# Patient Record
Sex: Male | Born: 1935
Health system: Southern US, Community
[De-identification: ages and names within clinical notes are randomized; demographics above are authoritative.]

## PROBLEM LIST (undated history)

## (undated) DIAGNOSIS — R42 Dizziness and giddiness: Secondary | ICD-10-CM

## (undated) DIAGNOSIS — M1990 Unspecified osteoarthritis, unspecified site: Secondary | ICD-10-CM

## (undated) DIAGNOSIS — I10 Essential (primary) hypertension: Secondary | ICD-10-CM

## (undated) DIAGNOSIS — E119 Type 2 diabetes mellitus without complications: Secondary | ICD-10-CM

## (undated) DIAGNOSIS — I499 Cardiac arrhythmia, unspecified: Secondary | ICD-10-CM

## (undated) HISTORY — PX: EYE SURGERY: SHX253

---

## 2004-05-14 ENCOUNTER — Ambulatory Visit (HOSPITAL_COMMUNITY): Admission: RE | Admit: 2004-05-14 | Discharge: 2004-05-15 | Payer: Self-pay | Admitting: Ophthalmology

## 2004-08-23 ENCOUNTER — Ambulatory Visit (HOSPITAL_COMMUNITY): Admission: RE | Admit: 2004-08-23 | Discharge: 2004-08-24 | Payer: Self-pay | Admitting: Ophthalmology

## 2004-09-10 ENCOUNTER — Ambulatory Visit (HOSPITAL_COMMUNITY): Admission: RE | Admit: 2004-09-10 | Discharge: 2004-09-11 | Payer: Self-pay | Admitting: Ophthalmology

## 2005-06-19 IMAGING — CR DG CHEST 2V
2 series · 2 of 2 positions shown · non-contrast
Comparison: None.

CLINICAL DATA: Pre-op for retinal detachment. 
 CHEST - TWO VIEW:

[view not recorded (1 of 2)]
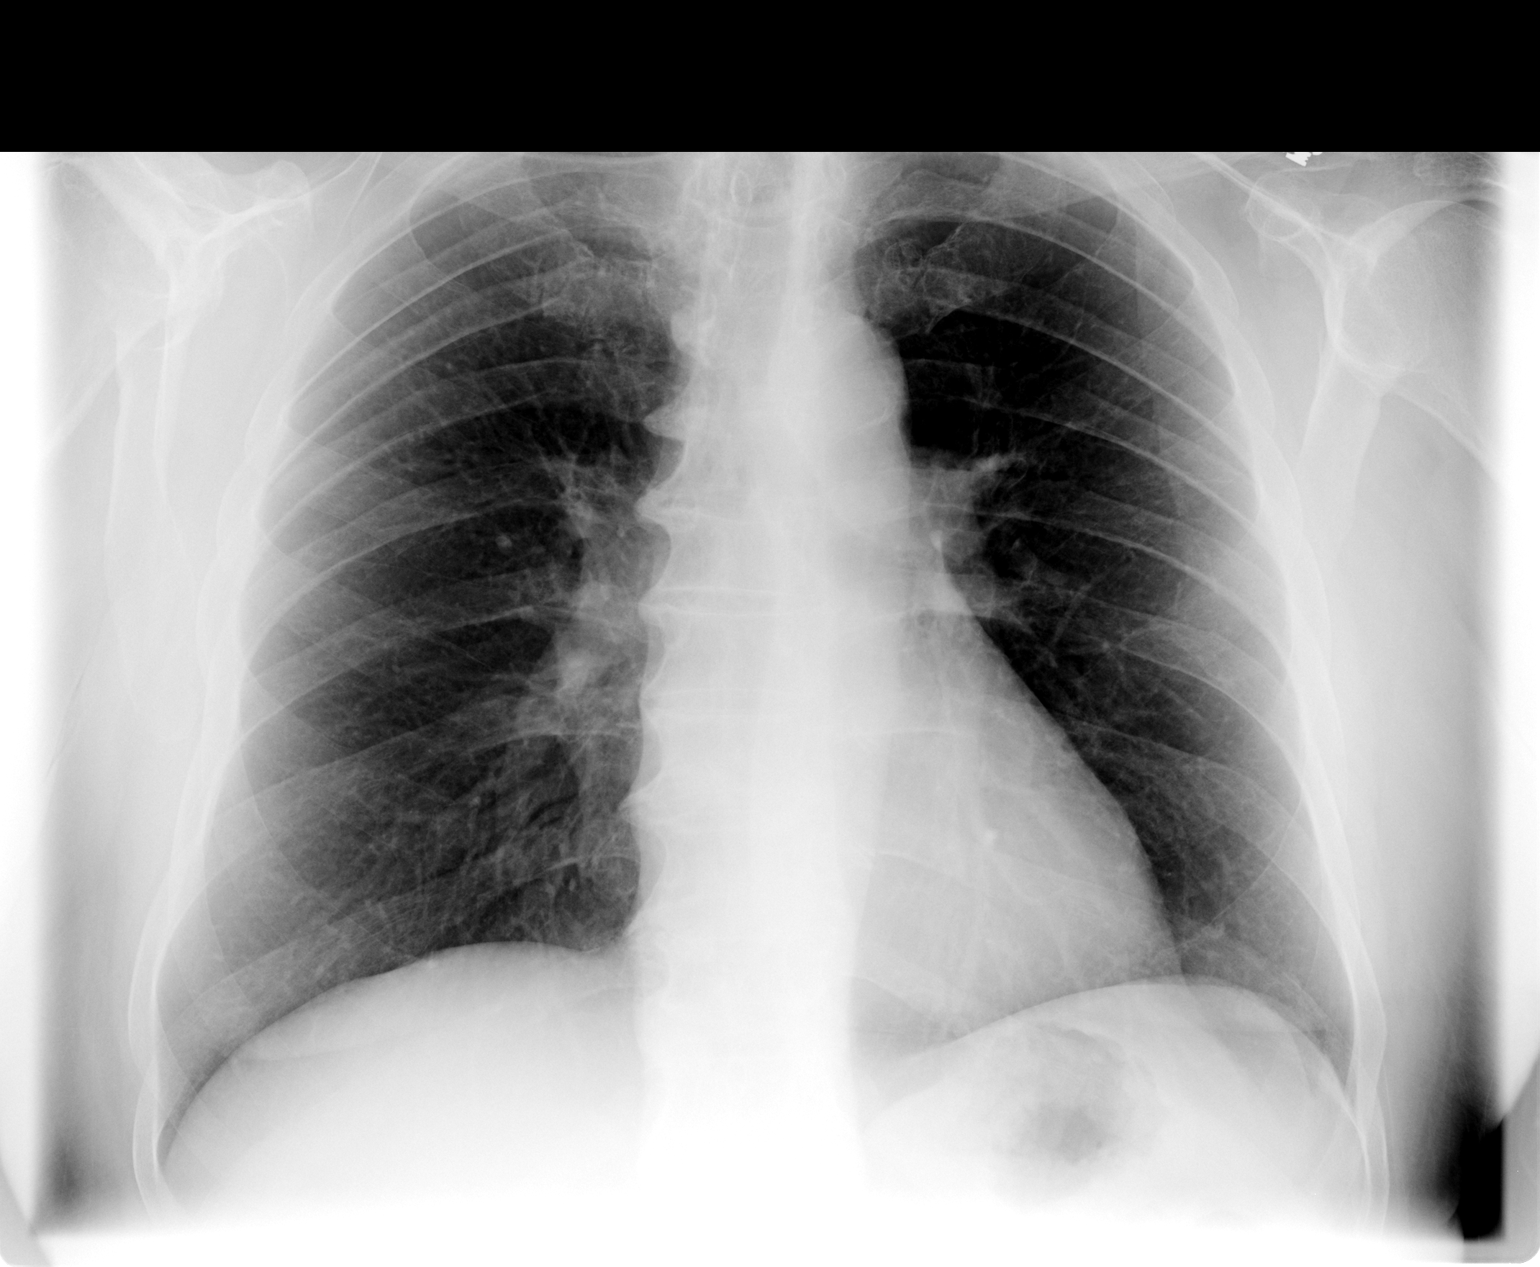

[view not recorded (2 of 2)]
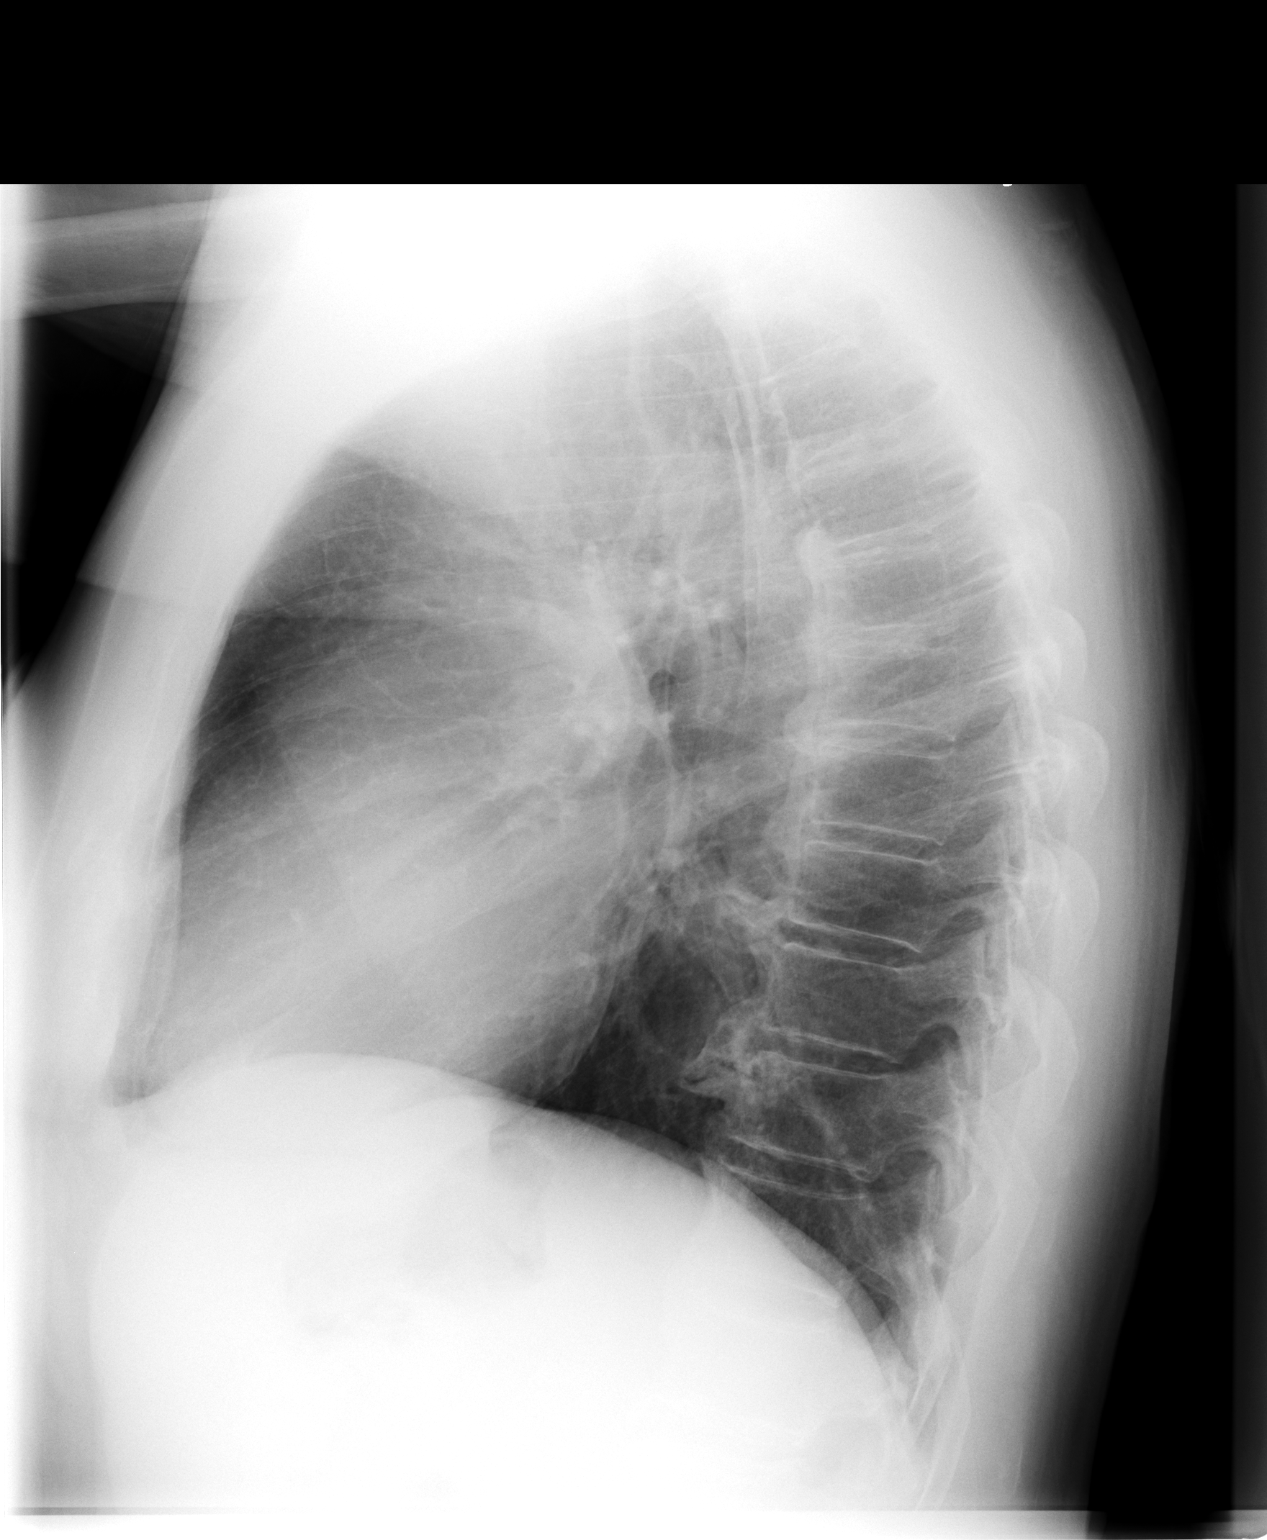

[2 of 2 positions shown; findings below may reference images not displayed]

FINDINGS: Heart and vascularity normal.  Lungs mildly hyperaerated.  Degenerative changes of the thoracic spine with prominent osteophytes.  
 No acute changes.
IMPRESSION: Chronic lung changes and degenerative changes of the thoracic spine.   No active disease.

## 2012-05-17 ENCOUNTER — Other Ambulatory Visit: Payer: Self-pay | Admitting: Ophthalmology

## 2012-06-03 ENCOUNTER — Encounter (HOSPITAL_COMMUNITY): Payer: Self-pay | Admitting: Pharmacy Technician

## 2012-06-03 ENCOUNTER — Encounter (HOSPITAL_COMMUNITY): Payer: Self-pay | Admitting: *Deleted

## 2012-06-03 NOTE — Progress Notes (Signed)
Pt siaid he had a EKG at [facility].  I faxed a request to [facility] and they called back and said that they do not have an EKG for this patient- they did say he had had surgey there this year.  I faxed a  Request to [facility], requesting EKGs , 2D echo and stress test and Chest x rays.

## 2012-06-04 ENCOUNTER — Ambulatory Visit (HOSPITAL_COMMUNITY): Payer: Medicare Other

## 2012-06-04 ENCOUNTER — Encounter (HOSPITAL_COMMUNITY): Payer: Self-pay | Admitting: *Deleted

## 2012-06-04 ENCOUNTER — Other Ambulatory Visit: Payer: Self-pay

## 2012-06-04 ENCOUNTER — Ambulatory Visit (HOSPITAL_COMMUNITY)
Admission: RE | Admit: 2012-06-04 | Discharge: 2012-06-04 | Disposition: A | Discharge status: Home / Self Care | Payer: Medicare Other | Source: Ambulatory Visit | Attending: Ophthalmology | Admitting: Ophthalmology

## 2012-06-04 ENCOUNTER — Ambulatory Visit (HOSPITAL_COMMUNITY): Payer: Medicare Other | Admitting: *Deleted

## 2012-06-04 ENCOUNTER — Encounter (HOSPITAL_COMMUNITY)
Admission: RE | Disposition: A | Discharge status: Home / Self Care | Payer: Self-pay | Source: Ambulatory Visit | Attending: Ophthalmology

## 2012-06-04 DIAGNOSIS — I4891 Unspecified atrial fibrillation: Secondary | ICD-10-CM | POA: Insufficient documentation

## 2012-06-04 DIAGNOSIS — H33009 Unspecified retinal detachment with retinal break, unspecified eye: Secondary | ICD-10-CM | POA: Insufficient documentation

## 2012-06-04 DIAGNOSIS — H3340 Traction detachment of retina, unspecified eye: Secondary | ICD-10-CM | POA: Insufficient documentation

## 2012-06-04 DIAGNOSIS — E11359 Type 2 diabetes mellitus with proliferative diabetic retinopathy without macular edema: Secondary | ICD-10-CM | POA: Insufficient documentation

## 2012-06-04 DIAGNOSIS — E1139 Type 2 diabetes mellitus with other diabetic ophthalmic complication: Secondary | ICD-10-CM | POA: Insufficient documentation

## 2012-06-04 DIAGNOSIS — H3342 Traction detachment of retina, left eye: Secondary | ICD-10-CM

## 2012-06-04 DIAGNOSIS — I10 Essential (primary) hypertension: Secondary | ICD-10-CM | POA: Insufficient documentation

## 2012-06-04 HISTORY — DX: Essential (primary) hypertension: I10

## 2012-06-04 HISTORY — DX: Cardiac arrhythmia, unspecified: I49.9

## 2012-06-04 HISTORY — DX: Type 2 diabetes mellitus without complications: E11.9

## 2012-06-04 HISTORY — PX: PARS PLANA VITRECTOMY: SHX2166

## 2012-06-04 HISTORY — DX: Dizziness and giddiness: R42

## 2012-06-04 HISTORY — DX: Unspecified osteoarthritis, unspecified site: M19.90

## 2012-06-04 HISTORY — PX: SILICON OIL REMOVAL: SHX5305

## 2012-06-04 LAB — BASIC METABOLIC PANEL
BUN: 27 mg/dL — ABNORMAL HIGH (ref 6–23)
CO2: 22 mEq/L (ref 19–32)
Calcium: 9.7 mg/dL (ref 8.4–10.5)
Chloride: 102 mEq/L (ref 96–112)
Creatinine, Ser: 1.18 mg/dL (ref 0.50–1.35)
GFR calc Af Amer: 68 mL/min — ABNORMAL LOW (ref >90)
GFR calc non Af Amer: 59 mL/min — ABNORMAL LOW (ref >90)
Glucose, Bld: 154 mg/dL — ABNORMAL HIGH (ref 70–99)
Potassium: 4.2 mEq/L (ref 3.5–5.1)
Sodium: 138 mEq/L (ref 135–145)

## 2012-06-04 LAB — GLUCOSE, CAPILLARY
Glucose-Capillary: 150 mg/dL — ABNORMAL HIGH (ref 70–99)
Glucose-Capillary: 147 mg/dL — ABNORMAL HIGH (ref 70–99)

## 2012-06-04 LAB — CBC
HCT: 40.6 % (ref 39.0–52.0)
Hemoglobin: 13.5 g/dL (ref 13.0–17.0)
MCH: 28.8 pg (ref 26.0–34.0)
MCHC: 33.3 g/dL (ref 30.0–36.0)
MCV: 86.6 fL (ref 78.0–100.0)
Platelets: 194 10*3/uL (ref 150–400)
RBC: 4.69 MIL/uL (ref 4.22–5.81)
RDW: 14.5 % (ref 11.5–15.5)
WBC: 6.9 10*3/uL (ref 4.0–10.5)

## 2012-06-04 LAB — PROTIME-INR
INR: 1.95 — ABNORMAL HIGH (ref 0.00–1.49)
Prothrombin Time: 21.5 seconds — ABNORMAL HIGH (ref 11.6–15.2)

## 2012-06-04 LAB — SURGICAL PCR SCREEN
MRSA, PCR: NEGATIVE
Staphylococcus aureus: POSITIVE — AB

## 2012-06-04 LAB — APTT: aPTT: 40 seconds — ABNORMAL HIGH (ref 24–37)

## 2012-06-04 SURGERY — PARS PLANA VITRECTOMY WITH 25 GAUGE
Anesthesia: Monitor Anesthesia Care | Site: Eye | Laterality: Left | Wound class: Clean

## 2012-06-04 MED ORDER — METOPROLOL TARTRATE 50 MG PO TABS
50.0000 mg | ORAL_TABLET | Freq: Once | ORAL | Status: AC
  Administered 2012-06-04: 50 mg via ORAL

## 2012-06-04 MED ORDER — BSS PLUS IO SOLN
INTRAOCULAR | Status: AC
  Filled 2012-06-04: qty 500

## 2012-06-04 MED ORDER — EPINEPHRINE HCL 1 MG/ML IJ SOLN
INTRAOCULAR | Status: DC | PRN
  Administered 2012-06-04: 12:00:00

## 2012-06-04 MED ORDER — PROPOFOL 10 MG/ML IV BOLUS
INTRAVENOUS | Status: DC | PRN
  Administered 2012-06-04 (×2): 60 mg via INTRAVENOUS

## 2012-06-04 MED ORDER — LIDOCAINE HCL (CARDIAC) 20 MG/ML IV SOLN
INTRAVENOUS | Status: DC | PRN
  Administered 2012-06-04: 10 mg via INTRAVENOUS

## 2012-06-04 MED ORDER — DEXAMETHASONE SODIUM PHOSPHATE 10 MG/ML IJ SOLN
INTRAMUSCULAR | Status: DC | PRN
  Administered 2012-06-04: 10 mg via INTRAVENOUS

## 2012-06-04 MED ORDER — HYPROMELLOSE (GONIOSCOPIC) 2.5 % OP SOLN
OPHTHALMIC | Status: DC | PRN
  Administered 2012-06-04: 2 [drp] via OPHTHALMIC

## 2012-06-04 MED ORDER — GENTAMICIN SULFATE 40 MG/ML IJ SOLN
INTRAMUSCULAR | Status: AC
  Filled 2012-06-04: qty 2

## 2012-06-04 MED ORDER — TETRACAINE HCL 0.5 % OP SOLN
OPHTHALMIC | Status: AC
  Filled 2012-06-04: qty 2

## 2012-06-04 MED ORDER — METOPROLOL TARTRATE 50 MG PO TABS
ORAL_TABLET | ORAL | Status: AC
  Filled 2012-06-04: qty 1

## 2012-06-04 MED ORDER — 0.9 % SODIUM CHLORIDE (POUR BTL) OPTIME
TOPICAL | Status: DC | PRN
  Administered 2012-06-04: 1000 mL

## 2012-06-04 MED ORDER — CEFAZOLIN SODIUM 1-5 GM-% IV SOLN
INTRAVENOUS | Status: AC
  Filled 2012-06-04: qty 50

## 2012-06-04 MED ORDER — BUPIVACAINE HCL (PF) 0.25 % IJ SOLN
INTRAMUSCULAR | Status: AC
  Filled 2012-06-04: qty 30

## 2012-06-04 MED ORDER — MUPIROCIN 2 % EX OINT
TOPICAL_OINTMENT | Freq: Once | CUTANEOUS | Status: AC
  Administered 2012-06-04: 10:00:00 via NASAL
  Filled 2012-06-04 (×2): qty 22

## 2012-06-04 MED ORDER — HYPROMELLOSE (GONIOSCOPIC) 2.5 % OP SOLN
OPHTHALMIC | Status: AC
  Filled 2012-06-04: qty 15

## 2012-06-04 MED ORDER — CEFAZOLIN SODIUM 1-5 GM-% IV SOLN
INTRAVENOUS | Status: DC | PRN
  Administered 2012-06-04: 1 g via INTRAVENOUS

## 2012-06-04 MED ORDER — POLYMYXIN B SULFATE 500000 UNITS IJ SOLR
INTRAMUSCULAR | Status: AC
  Filled 2012-06-04: qty 1

## 2012-06-04 MED ORDER — LACTATED RINGERS IV SOLN
INTRAVENOUS | Status: DC | PRN
  Administered 2012-06-04: 12:00:00 via INTRAVENOUS

## 2012-06-04 MED ORDER — LIDOCAINE HCL 2 % IJ SOLN
INTRAMUSCULAR | Status: DC | PRN
  Administered 2012-06-04: 20 mL

## 2012-06-04 MED ORDER — SODIUM HYALURONATE 10 MG/ML IO SOLN
INTRAOCULAR | Status: AC
  Filled 2012-06-04: qty 0.85

## 2012-06-04 MED ORDER — NA CHONDROIT SULF-NA HYALURON 40-30 MG/ML IO SOLN
INTRAOCULAR | Status: AC
  Filled 2012-06-04: qty 0.5

## 2012-06-04 MED ORDER — STERILE WATER FOR IRRIGATION IR SOLN
Status: DC | PRN
  Administered 2012-06-04: 1000 mL

## 2012-06-04 MED ORDER — DEXAMETHASONE SODIUM PHOSPHATE 10 MG/ML IJ SOLN
INTRAMUSCULAR | Status: AC
  Filled 2012-06-04: qty 1

## 2012-06-04 MED ORDER — LIDOCAINE HCL 2 % IJ SOLN
INTRAMUSCULAR | Status: AC
  Filled 2012-06-04: qty 20

## 2012-06-04 MED ORDER — EPINEPHRINE HCL 1 MG/ML IJ SOLN
INTRAMUSCULAR | Status: AC
  Filled 2012-06-04: qty 1

## 2012-06-04 SURGICAL SUPPLY — 71 items
ACCESSORY FRAGMATOME (MISCELLANEOUS) IMPLANT
APPLICATOR COTTON TIP 6IN STRL (MISCELLANEOUS) ×2 IMPLANT
APPLICATOR DR MATTHEWS STRL (MISCELLANEOUS) IMPLANT
BLADE MVR KNIFE 19G (BLADE) IMPLANT
BLADE MVR KNIFE 20G (BLADE) ×2 IMPLANT
CANNULA ANT CHAM MAIN (OPHTHALMIC RELATED) IMPLANT
CANNULA FLEX TIP 25G (CANNULA) ×2 IMPLANT
CLOTH BEACON ORANGE TIMEOUT ST (SAFETY) ×2 IMPLANT
CORDS BIPOLAR (ELECTRODE) IMPLANT
COVER SURGICAL LIGHT HANDLE (MISCELLANEOUS) ×2 IMPLANT
DRAPE INCISE 51X51 W/FILM STRL (DRAPES) ×2 IMPLANT
DRAPE OPHTHALMIC 77X100 STRL (CUSTOM PROCEDURE TRAY) ×2 IMPLANT
ERASER HMR WETFIELD 23G BP (MISCELLANEOUS) IMPLANT
FILTER BLUE MILLIPORE (MISCELLANEOUS) IMPLANT
FILTER STRAW FLUID ASPIR (MISCELLANEOUS) ×2 IMPLANT
FORCEPS ECKARDT ILM 25G SERR (OPHTHALMIC RELATED) IMPLANT
FORCEPS HORIZONTAL 25G DISP (OPHTHALMIC RELATED) IMPLANT
GAS OPHTHALMIC (MISCELLANEOUS) IMPLANT
GLOVE BIOGEL PI IND STRL 6.5 (GLOVE) ×1 IMPLANT
GLOVE BIOGEL PI INDICATOR 6.5 (GLOVE) ×1
GLOVE SS BIOGEL STRL SZ 8.5 (GLOVE) ×1 IMPLANT
GLOVE SUPERSENSE BIOGEL SZ 8.5 (GLOVE) ×1
GLOVE SURG SS PI 6.5 STRL IVOR (GLOVE) ×2 IMPLANT
GOWN EXTRA PROTECTION XL (GOWNS) ×2 IMPLANT
GOWN STRL NON-REIN LRG LVL3 (GOWN DISPOSABLE) ×4 IMPLANT
ILLUMINATOR ENDO 25GA (MISCELLANEOUS) ×2 IMPLANT
KIT BASIN OR (CUSTOM PROCEDURE TRAY) ×2 IMPLANT
KIT PERFLUORON PROCEDURE 5ML (MISCELLANEOUS) IMPLANT
KIT ROOM TURNOVER OR (KITS) ×2 IMPLANT
KNIFE CRESCENT 2.5 55 ANG (BLADE) IMPLANT
LENS BIOM SUPER VIEW SET DISP (OPHTHALMIC RELATED) ×2 IMPLANT
MARKER SKIN DUAL TIP RULER LAB (MISCELLANEOUS) IMPLANT
MASK EYE SHIELD (GAUZE/BANDAGES/DRESSINGS) ×2 IMPLANT
MICROPICK 25G (MISCELLANEOUS)
NEEDLE 18GX1X1/2 (RX/OR ONLY) (NEEDLE) ×2 IMPLANT
NEEDLE 25GX 5/8IN NON SAFETY (NEEDLE) ×2 IMPLANT
NEEDLE FILTER BLUNT 18X 1/2SAF (NEEDLE)
NEEDLE FILTER BLUNT 18X1 1/2 (NEEDLE) IMPLANT
NEEDLE HYPO 25GX1X1/2 BEV (NEEDLE) IMPLANT
NEEDLE HYPO 30X.5 LL (NEEDLE) IMPLANT
NS IRRIG 1000ML POUR BTL (IV SOLUTION) ×2 IMPLANT
OIL SILICONE OPHTHALMIC ADAPTO (Ophthalmic Related) ×2 IMPLANT
PACK VITRECTOMY CUSTOM (CUSTOM PROCEDURE TRAY) ×2 IMPLANT
PACK VITRECTOMY PIK 25GA (MISCELLANEOUS) ×2 IMPLANT
PAD ARMBOARD 7.5X6 YLW CONV (MISCELLANEOUS) ×4 IMPLANT
PAD EYE OVAL STERILE LF (GAUZE/BANDAGES/DRESSINGS) ×2 IMPLANT
PAK VITRECTOMY PIK 25 GA (OPHTHALMIC RELATED) ×2 IMPLANT
PENCIL BIPOLAR 25GA STR DISP (OPHTHALMIC RELATED) IMPLANT
PICK MICROPICK 25G (MISCELLANEOUS) IMPLANT
PROBE COHERENT CURVED (MISCELLANEOUS) ×2 IMPLANT
PROBE DIRECTIONAL LASER (MISCELLANEOUS) ×2 IMPLANT
REPL STRA BRUSH NEEDLE (NEEDLE) ×2 IMPLANT
RESERVOIR BACK FLUSH (MISCELLANEOUS) ×2 IMPLANT
ROLLS DENTAL (MISCELLANEOUS) ×4 IMPLANT
SCRAPER DIAMOND 25GA (OPHTHALMIC RELATED) IMPLANT
SET FLUID INJECTOR (SET/KITS/TRAYS/PACK) ×2 IMPLANT
STOCKINETTE IMPERVIOUS 9X36 MD (GAUZE/BANDAGES/DRESSINGS) ×4 IMPLANT
STOPCOCK 4 WAY LG BORE MALE ST (IV SETS) IMPLANT
SUT ETHILON 10 0 CS140 6 (SUTURE) IMPLANT
SUT ETHILON 8 0 BV130 4 (SUTURE) IMPLANT
SUT MERSILENE 5 0 RD 1 DA (SUTURE) ×2 IMPLANT
SUT PROLENE 10 0 CIF 4 DA (SUTURE) IMPLANT
SUT VICRYL 7 0 TG140 8 (SUTURE) IMPLANT
SYR 30ML SLIP (SYRINGE) IMPLANT
SYR 5ML LL (SYRINGE) ×2 IMPLANT
SYR TB 1ML LUER SLIP (SYRINGE) ×2 IMPLANT
TAPE SURG TRANSPORE 1 IN (GAUZE/BANDAGES/DRESSINGS) ×1 IMPLANT
TAPE SURGICAL TRANSPORE 1 IN (GAUZE/BANDAGES/DRESSINGS) ×1
TOWEL OR 17X24 6PK STRL BLUE (TOWEL DISPOSABLE) ×4 IMPLANT
WATER STERILE IRR 1000ML POUR (IV SOLUTION) ×2 IMPLANT
WIPE INSTRUMENT VISIWIPE 73X73 (MISCELLANEOUS) ×2 IMPLANT

## 2012-06-04 NOTE — Brief Op Note (Signed)
06/04/2012  2:27 PM  PATIENT:  Kevin Salas  77 y.o. male  PRE-OPERATIVE DIAGNOSIS:  RETINAL DETACHMENT LEFT EYE  POST-OPERATIVE DIAGNOSIS:  * No post-op diagnosis entered *  PROCEDURE:  Procedure(s) (LRB) with comments: PARS PLANA VITRECTOMY WITH 25 GAUGE (Left) PANRETINAL LASER NEONATAL (Left) - no extra supplies needed for panretinal laser SILICON OIL REMOVAL (Left) - AND INSERT SILICONE OIL 5000CS LEFT EYE,  PROCEDURE: TPPV 25G ENDOLASER, REMOVE SILICONE OIL AND INSERT SILICONE OIL 5000CS LEFT EYE  SURGEON:  Surgeon(s) and Role:    * Edmon Crape, MD - Primary  PHYSICIAN ASSISTANT:   ASSISTANTS: none   ANESTHESIA:   IV sedation  EBL:  Total I/O In: 400 [I.V.:400] Out: 5 [Blood:5]  BLOOD ADMINISTERED:none  DRAINS: none   LOCAL MEDICATIONS USED:  XYLOCAINE 10 CC RETROBULBAR  SPECIMEN:  No Specimen  DISPOSITION OF SPECIMEN:  N/A  COUNTS:  YES  TOURNIQUET:  * No tourniquets in log *  DICTATION: .Other Dictation: Dictation Number  [id number]  PLAN OF CARE: Discharge to home after PACU  PATIENT DISPOSITION:  PACU - hemodynamically stable.   Delay start of Pharmacological VTE agent (>24hrs) due to surgical blood loss or risk of bleeding: not applicable

## 2012-06-04 NOTE — Preoperative (Signed)
Beta Blockers   Reason not to administer Beta Blockers:Not Applicable 

## 2012-06-04 NOTE — Progress Notes (Signed)
Called and spoke with Dr. Ephriam Knuckles assistant regarding need for orders for eye drops to be placed in the computer. Stated she will get him to put them in computer.

## 2012-06-04 NOTE — Anesthesia Preprocedure Evaluation (Addendum)
Anesthesia Evaluation  Patient identified by MRN, date of birth, ID band Patient awake    Reviewed: Allergy & Precautions, H&P , NPO status , Patient's Chart, lab work & pertinent test results, reviewed documented beta blocker date and time   Airway Mallampati: II TM Distance: >3 FB Neck ROM: Full    Dental  (+) Chipped and Dental Advisory Given   Pulmonary  breath sounds clear to auscultation  Pulmonary exam normal       Cardiovascular hypertension, Pt. on medications and Pt. on home beta blockers + dysrhythmias Atrial Fibrillation Rhythm:Irregular Rate:Normal     Neuro/Psych    GI/Hepatic negative GI ROS, Neg liver ROS,   Endo/Other  diabetes, Well Controlled, Type 2, Oral Hypoglycemic Agents  Renal/GU negative Renal ROS     Musculoskeletal   Abdominal   Peds  Hematology   Anesthesia Other Findings   Reproductive/Obstetrics                          Anesthesia Physical Anesthesia Plan  ASA: III  Anesthesia Plan: MAC   Post-op Pain Management:    Induction: Intravenous  Airway Management Planned: Simple Face Mask  Additional Equipment:   Intra-op Plan:   Post-operative Plan:   Informed Consent: I have reviewed the patients History and Physical, chart, labs and discussed the procedure including the risks, benefits and alternatives for the proposed anesthesia with the patient or authorized representative who has indicated his/her understanding and acceptance.   Dental advisory given  Plan Discussed with: CRNA, Anesthesiologist and Surgeon  Anesthesia Plan Comments:         Anesthesia Quick Evaluation

## 2012-06-04 NOTE — Transfer of Care (Signed)
Immediate Anesthesia Transfer of Care Note  Patient: Kevin Salas  Procedure(s) Performed: Procedure(s) (LRB) with comments: PARS PLANA VITRECTOMY WITH 25 GAUGE (Left) PANRETINAL LASER NEONATAL (Left) - no extra supplies needed for panretinal laser SILICON OIL REMOVAL (Left) - AND INSERT SILICONE OIL 5000CS LEFT EYE,  PROCEDURE: TPPV 25G ENDOLASER, REMOVE SILICONE OIL AND INSERT SILICONE OIL 5000CS LEFT EYE  Patient Location: PACU and Short Stay  Anesthesia Type:MAC  Level of Consciousness: awake, alert  and oriented  Airway & Oxygen Therapy: Patient Spontanous Breathing  Post-op Assessment: Report given to PACU RN and Post -op Vital signs reviewed and stable  Post vital signs: Reviewed and stable  Complications: No apparent anesthesia complications

## 2012-06-04 NOTE — H&P (Signed)
Kevin Salas is an 77 y.o. male.   Chief Complaint: recurrent vision loss left eye  HPI:   HISTORY OF RETINAL DETACHMENT  AND REPAIR OS, WITH SUBSEQUENT TRACTION RETINAL DETACHMENT REPAIRED VIA VITRECTOMY, LASER AND INJ OF SILICONE OIL.  NOW WITH RECURRENT DETACHMENT NASALLY OS.  NEEDS OIL REMOVAL, RETINAL DETACHMENT REPAIR, AND REINSTILLATION OF OIL OS.    Past Medical History  Diagnosis Date  . Diabetes mellitus without complication   . Hypertension   . Vertigo   . Dysrhythmia   . Arthritis     Past Surgical History  Procedure Date  . Eye surgery     Retina surgery.  Catartact Right eye.    History reviewed. No pertinent family history. Social History:  reports that he has never smoked. He does not have any smokeless tobacco history on file. He reports that he does not drink alcohol or use illicit drugs.  Allergies: No Known Allergies  Medications Prior to Admission  Medication Sig Dispense Refill  . amLODipine (NORVASC) 10 MG tablet Take 10 mg by mouth daily.      Marland Kitchen glipiZIDE (GLUCOTROL) 5 MG tablet Take 5 mg by mouth 2 (two) times daily before a meal.      . meclizine (ANTIVERT) 25 MG tablet Take 25 mg by mouth 2 (two) times daily as needed. For vertigo      . metoprolol (LOPRESSOR) 50 MG tablet Take 50 mg by mouth 2 (two) times daily.      . niacin 500 MG tablet Take 500 mg by mouth at bedtime.      Marland Kitchen ofloxacin (OCUFLOX) 0.3 % ophthalmic solution Place 1 drop into the left eye 4 (four) times daily.      . pravastatin (PRAVACHOL) 40 MG tablet Take 20 mg by mouth daily.      . prednisoLONE acetate (PRED FORTE) 1 % ophthalmic suspension Place 1 drop into the left eye 4 (four) times daily.      . traMADol (ULTRAM) 50 MG tablet Take 50 mg by mouth every 6 (six) hours as needed. For knee pain      . warfarin (COUMADIN) 5 MG tablet Take 5 mg by mouth daily.        Results for orders placed during the hospital encounter of 06/04/12 (from the past 48 hour(s))  SURGICAL PCR  SCREEN     Status: Abnormal   Collection Time   06/04/12  9:51 AM      Component Value Range Comment   MRSA, PCR NEGATIVE  NEGATIVE    Staphylococcus aureus POSITIVE (*) NEGATIVE   BASIC METABOLIC PANEL     Status: Abnormal   Collection Time   06/04/12  9:58 AM      Component Value Range Comment   Sodium 138  135 - 145 mEq/L    Potassium 4.2  3.5 - 5.1 mEq/L    Chloride 102  96 - 112 mEq/L    CO2 22  19 - 32 mEq/L    Glucose, Bld 154 (*) 70 - 99 mg/dL    BUN 27 (*) 6 - 23 mg/dL    Creatinine, Ser 1.61  0.50 - 1.35 mg/dL    Calcium 9.7  8.4 - 09.6 mg/dL    GFR calc non Af Amer 59 (*) >90 mL/min    GFR calc Af Amer 68 (*) >90 mL/min   CBC     Status: Normal   Collection Time   06/04/12  9:58 AM  Component Value Range Comment   WBC 6.9  4.0 - 10.5 K/uL    RBC 4.69  4.22 - 5.81 MIL/uL    Hemoglobin 13.5  13.0 - 17.0 g/dL    HCT 40.9  81.1 - 91.4 %    MCV 86.6  78.0 - 100.0 fL    MCH 28.8  26.0 - 34.0 pg    MCHC 33.3  30.0 - 36.0 g/dL    RDW 78.2  95.6 - 21.3 %    Platelets 194  150 - 400 K/uL   APTT     Status: Abnormal   Collection Time   06/04/12  9:58 AM      Component Value Range Comment   aPTT 40 (*) 24 - 37 seconds   PROTIME-INR     Status: Abnormal   Collection Time   06/04/12  9:58 AM      Component Value Range Comment   Prothrombin Time 21.5 (*) 11.6 - 15.2 seconds    INR 1.95 (*) 0.00 - 1.49   GLUCOSE, CAPILLARY     Status: Abnormal   Collection Time   06/04/12 10:09 AM      Component Value Range Comment   Glucose-Capillary 150 (*) 70 - 99 mg/dL    Dg Chest 2 View  02/03/5783  *RADIOLOGY REPORT*  Clinical Data: Preop for lies surgery.  Hypertension.  Diabetes.  CHEST - 2 VIEW  Comparison: 05/14/2004  Findings: The heart, mediastinum and hila are unremarkable.  The lungs are clear.  Degenerative changes are noted along the thoracic spine.  No significant change from the prior study.  IMPRESSION: No active disease of the chest.   Original Report Authenticated By:  Amie Portland, M.D.     Review of Systems  Constitutional: Negative.   HENT: Negative.   Eyes: Positive for blurred vision.  Respiratory: Negative.   Cardiovascular: Negative.   Gastrointestinal: Negative.   Genitourinary: Negative.   Musculoskeletal: Negative.   Skin: Negative.   Neurological: Negative.   Psychiatric/Behavioral: Negative.     Blood pressure 155/79, pulse 93, temperature 97.5 F (36.4 C), temperature source Oral, resp. rate 18, SpO2 96.00%. Physical Exam  Constitutional: He is oriented to person, place, and time. He appears well-developed and well-nourished.  HENT:  Head: Normocephalic.  Eyes: EOM are normal.         Recurrent traction rhegmatogenous retinal detachment OS, PVR. STAGE C3.  UNDER silicone.   Neck: Normal range of motion. Neck supple.  Cardiovascular: Normal heart sounds.   Respiratory: Effort normal.  GI: Soft.  Neurological: He is alert and oriented to person, place, and time.  Skin: Skin is warm and dry.  Psychiatric: He has a normal mood and affect. His behavior is normal. Judgment and thought content normal.     Assessment/Plan TRACTION RHEGMATOGENOUS RETINAL DETACHMENT RECURRENT, FOR REPEAT REPAIR OF COMPLEX RETINAL DETACHMENT VIA VITRECTOMY, LASER, MEMBRANE PEEL AND REINSTILLATION OF OIL LEFT EYE.  PLANNED UNDER LOCAL MAC WITH RETROBULBAR ANESTHESIA.     Bethene Hankinson A 06/04/2012, 12:53 PM

## 2012-06-04 NOTE — Progress Notes (Signed)
Arrived via wc from or .Marland Kitchen Eye patch and shield  To os.  Serous sanguineous  Stains to  Patch.  Moderate amount.

## 2012-06-04 NOTE — Anesthesia Postprocedure Evaluation (Signed)
Anesthesia Post Note  Patient: Kevin Salas  Procedure(s) Performed: Procedure(s) (LRB): PARS PLANA VITRECTOMY WITH 25 GAUGE (Left) PANRETINAL LASER NEONATAL (Left) SILICON OIL REMOVAL (Left)  Anesthesia type: MAC  Patient location: PACU  Post pain: Pain level controlled  Post assessment: Patient's Cardiovascular Status Stable  Last Vitals:  Filed Vitals:   06/04/12 1000  BP: 155/79  Pulse: 93  Temp: 36.4 C  Resp: 18    Post vital signs: Reviewed and stable  Level of consciousness: sedated  Complications: No apparent anesthesia complications

## 2012-06-04 NOTE — Progress Notes (Signed)
IV removed from  Left hand   Tip intact.

## 2012-06-05 NOTE — Op Note (Signed)
NAMEMarland Kitchen  [place], [place] NO.:  [id number]  MEDICAL RECORD NO.:  [id number]  LOCATION:  [facility]                         FACILITY:  [facility]  PHYSICIAN:  Alford Highland. Aleane Wesenberg, M.D.   DATE OF BIRTH:  April 13, 1935  DATE OF PROCEDURE:  06/04/2012 DATE OF DISCHARGE:  06/04/2012                              OPERATIVE REPORT   PREOPERATIVE DIAGNOSES: 1. 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 2. Proliferative vitreal retinopathy, stage C3.  POSTOPERATIVE DIAGNOSES: 1. 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 2. Proliferative vitreal retinopathy, stage C3.  PROCEDURE: 1. Posterior vitrectomy and repair, complex retinal detachment via     vitrectomy, membrane peel, endolaser photocoagulation, and     insulation of silicone oil. 2. Removal of silicone oil-posterior implant prior to performance of     the retinal reattachment surgery.  SURGEON:  Alford Highland. Traves Majchrzak, M.D.  ANESTHESIA:  Local retrobulbar monitored control.  INDICATION FOR PROCEDURE:  The patient is a 77 year old man with recurrent combined traction rhegmatogenous retinal detachment, left eye. After primary repair via scleral buckle was complicated by development of proliferative vitreoretinopathy, recurrent retinal detachment repaired primarily, secondarily with vitrectomy, laser membrane peel and insulation wall.  The patient has suffered a recurrent detachment, found to have a star fold at the 7 o'clock position off slip of the buckle. He has a detachment extending inferonasal, inferotemporal, and superiorly into the macular region with only a superonasal quadrant attached with heavy chorioretinal scar from previous laser photocoagulation.  The patient says this is a desperate attempt to salvage the globe and potentially ambulatory vision in his right eye. Anterior implant on long-term insulation of silicone oil, and salvage of the  globe and retinal reattachment.  He understands the risks of anesthesia including the occurrence of death, loss of the eye, including, but not limited to from the condition as well as surgical repair, hemorrhage, infection, scarring, need for another surgery, change in vision, loss of vision, progressive of disease despite intervention.  DESCRIPTION OF PROCEDURE:  Appropriate signed consent was obtained, the patient was taken to the operating room.  In the operating room, appropriate monitors were followed by mild sedation.  Proper site selection was confirmed with the operating staff.  Thereafter, under mild sedation, 2% Xylocaine, 5 mL injected retrobulbar with additional 5 mL laterally in fashion of modified Darel Hong.  The left periocular region prepped and draped in the usual ophthalmic fashion.  A lid speculum applied.  A 25-gauge trocar placed in the inferotemporal quadrant, placement verified visually and infusion turned on. Supranasally, the conjunctiva was opened and a 20-gauge MVR was then used to create a retinotomy.  Superotemporally, there is significant conjunctival retraction and scarring fairly posterior on the globe and for this reason an area of some remnants of conjunctiva, 25-gauge trocar was applied.  At this time, insertion of an 18-gauge silastic cannula attached to the vitreous extraction site was then performed and silicone oil was extracted without difficulty under direct visualization.  Star fold was confirmed at 7 o'clock position.  The retinal detachment confirmed.  Using 20-gauge __________ forceps, the star  fold was resected and this mobilized retina nicely in this region.  Small retinotomy was created at this location as well as inferonasal to the optic nerve __________ detachment as well as supranasally right optic nerve __________ retinal detachment.  No other remaining star folds were identified.  No other tractional changes were noted.  At this  time, fluid air exchange completed. The retina reattached nicely.  All subretinal fluid was removed without difficulty.  Endolaser photocoagulation of the 20-gauge curved laser probe was then carried out, and the retina remained nicely attached.  At this time, a air- silicone oil exchange completed.  The superotemporal sclerotomy was closed with 7-0 Vicryl suture.  At this time, completion of the injection of the silicone oil was carried out to the appropriate field. The supranasal sclerotomy closed with 7-0 Vicryl suture.  The conjunctiva was closed with 7-0 Vicryl suture.  Thereafter, the inferotemporal sclerotomy for the infusion __________ 7-0 Vicryl suture was now closed after the infusion cannula had been removed. Subconjunctival Decadron applied.  Sterile patch and Fox shield applied. The patient tolerated the procedure well without complications and taken to PACU.     Alford Highland Selyna Klahn, M.D.     GAR/MEDQ  D:  06/04/2012  T:  06/05/2012  Job:  [id number]

## 2012-06-07 ENCOUNTER — Encounter (HOSPITAL_COMMUNITY): Payer: Self-pay | Admitting: Ophthalmology

## 2014-12-21 DIAGNOSIS — H31009 Unspecified chorioretinal scars, unspecified eye: Secondary | ICD-10-CM | POA: Diagnosis not present

## 2014-12-21 DIAGNOSIS — H3342 Traction detachment of retina, left eye: Secondary | ICD-10-CM | POA: Diagnosis not present

## 2014-12-21 DIAGNOSIS — E11339 Type 2 diabetes mellitus with moderate nonproliferative diabetic retinopathy without macular edema: Secondary | ICD-10-CM | POA: Diagnosis not present

## 2015-09-25 DIAGNOSIS — S6991XA Unspecified injury of right wrist, hand and finger(s), initial encounter: Secondary | ICD-10-CM | POA: Diagnosis not present

## 2015-09-25 DIAGNOSIS — W19XXXA Unspecified fall, initial encounter: Secondary | ICD-10-CM | POA: Diagnosis not present

## 2015-09-26 DIAGNOSIS — S52509A Unspecified fracture of the lower end of unspecified radius, initial encounter for closed fracture: Secondary | ICD-10-CM | POA: Diagnosis not present

## 2015-10-24 DIAGNOSIS — S52509A Unspecified fracture of the lower end of unspecified radius, initial encounter for closed fracture: Secondary | ICD-10-CM | POA: Diagnosis not present

## 2015-11-14 DIAGNOSIS — S52509A Unspecified fracture of the lower end of unspecified radius, initial encounter for closed fracture: Secondary | ICD-10-CM | POA: Diagnosis not present

## 2015-12-05 DIAGNOSIS — S52501A Unspecified fracture of the lower end of right radius, initial encounter for closed fracture: Secondary | ICD-10-CM | POA: Diagnosis not present

## 2016-01-02 DIAGNOSIS — S52501A Unspecified fracture of the lower end of right radius, initial encounter for closed fracture: Secondary | ICD-10-CM | POA: Diagnosis not present

## 2016-03-12 DIAGNOSIS — H3342 Traction detachment of retina, left eye: Secondary | ICD-10-CM | POA: Diagnosis not present

## 2016-03-12 DIAGNOSIS — H4720 Unspecified optic atrophy: Secondary | ICD-10-CM | POA: Diagnosis not present

## 2016-03-12 DIAGNOSIS — D3131 Benign neoplasm of right choroid: Secondary | ICD-10-CM | POA: Diagnosis not present

## 2016-03-12 DIAGNOSIS — E113391 Type 2 diabetes mellitus with moderate nonproliferative diabetic retinopathy without macular edema, right eye: Secondary | ICD-10-CM | POA: Diagnosis not present

## 2016-12-10 DIAGNOSIS — D3131 Benign neoplasm of right choroid: Secondary | ICD-10-CM | POA: Diagnosis not present

## 2016-12-10 DIAGNOSIS — E113391 Type 2 diabetes mellitus with moderate nonproliferative diabetic retinopathy without macular edema, right eye: Secondary | ICD-10-CM | POA: Diagnosis not present

## 2016-12-10 DIAGNOSIS — H43811 Vitreous degeneration, right eye: Secondary | ICD-10-CM | POA: Diagnosis not present

## 2016-12-10 DIAGNOSIS — H31009 Unspecified chorioretinal scars, unspecified eye: Secondary | ICD-10-CM | POA: Diagnosis not present

## 2017-06-29 DIAGNOSIS — T85398A Other mechanical complication of other ocular prosthetic devices, implants and grafts, initial encounter: Secondary | ICD-10-CM | POA: Diagnosis not present

## 2017-06-29 DIAGNOSIS — E113391 Type 2 diabetes mellitus with moderate nonproliferative diabetic retinopathy without macular edema, right eye: Secondary | ICD-10-CM | POA: Diagnosis not present

## 2017-06-29 DIAGNOSIS — D3131 Benign neoplasm of right choroid: Secondary | ICD-10-CM | POA: Diagnosis not present

## 2017-06-29 DIAGNOSIS — H211X2 Other vascular disorders of iris and ciliary body, left eye: Secondary | ICD-10-CM | POA: Diagnosis not present

## 2017-07-06 DIAGNOSIS — T85398A Other mechanical complication of other ocular prosthetic devices, implants and grafts, initial encounter: Secondary | ICD-10-CM | POA: Diagnosis not present

## 2017-07-06 DIAGNOSIS — H211X2 Other vascular disorders of iris and ciliary body, left eye: Secondary | ICD-10-CM | POA: Diagnosis not present

## 2017-07-06 DIAGNOSIS — H43811 Vitreous degeneration, right eye: Secondary | ICD-10-CM | POA: Diagnosis not present

## 2017-07-06 DIAGNOSIS — E113391 Type 2 diabetes mellitus with moderate nonproliferative diabetic retinopathy without macular edema, right eye: Secondary | ICD-10-CM | POA: Diagnosis not present

## 2017-07-10 DIAGNOSIS — T85321A Displacement of prosthetic orbit of left eye, initial encounter: Secondary | ICD-10-CM | POA: Diagnosis not present

## 2017-07-10 DIAGNOSIS — T85398A Other mechanical complication of other ocular prosthetic devices, implants and grafts, initial encounter: Secondary | ICD-10-CM | POA: Diagnosis not present

## 2018-06-17 DIAGNOSIS — J329 Chronic sinusitis, unspecified: Secondary | ICD-10-CM | POA: Diagnosis not present

## 2018-09-02 DIAGNOSIS — S52501A Unspecified fracture of the lower end of right radius, initial encounter for closed fracture: Secondary | ICD-10-CM | POA: Diagnosis not present

## 2018-09-02 DIAGNOSIS — S52351A Displaced comminuted fracture of shaft of radius, right arm, initial encounter for closed fracture: Secondary | ICD-10-CM | POA: Diagnosis not present

## 2018-09-02 DIAGNOSIS — S52601A Unspecified fracture of lower end of right ulna, initial encounter for closed fracture: Secondary | ICD-10-CM | POA: Diagnosis not present

## 2018-09-06 DIAGNOSIS — S52501A Unspecified fracture of the lower end of right radius, initial encounter for closed fracture: Secondary | ICD-10-CM | POA: Diagnosis not present

## 2018-09-08 DIAGNOSIS — S52501A Unspecified fracture of the lower end of right radius, initial encounter for closed fracture: Secondary | ICD-10-CM | POA: Diagnosis not present

## 2018-09-08 DIAGNOSIS — S52501D Unspecified fracture of the lower end of right radius, subsequent encounter for closed fracture with routine healing: Secondary | ICD-10-CM | POA: Diagnosis not present

## 2018-09-08 DIAGNOSIS — S52201A Unspecified fracture of shaft of right ulna, initial encounter for closed fracture: Secondary | ICD-10-CM | POA: Diagnosis not present

## 2018-09-08 DIAGNOSIS — S52201D Unspecified fracture of shaft of right ulna, subsequent encounter for closed fracture with routine healing: Secondary | ICD-10-CM | POA: Diagnosis not present

## 2018-09-22 DIAGNOSIS — S52501D Unspecified fracture of the lower end of right radius, subsequent encounter for closed fracture with routine healing: Secondary | ICD-10-CM | POA: Diagnosis not present

## 2018-09-22 DIAGNOSIS — S52201D Unspecified fracture of shaft of right ulna, subsequent encounter for closed fracture with routine healing: Secondary | ICD-10-CM | POA: Diagnosis not present

## 2018-09-22 DIAGNOSIS — S52501A Unspecified fracture of the lower end of right radius, initial encounter for closed fracture: Secondary | ICD-10-CM | POA: Diagnosis not present

## 2018-10-13 DIAGNOSIS — S52201D Unspecified fracture of shaft of right ulna, subsequent encounter for closed fracture with routine healing: Secondary | ICD-10-CM | POA: Diagnosis not present

## 2018-10-13 DIAGNOSIS — S52501D Unspecified fracture of the lower end of right radius, subsequent encounter for closed fracture with routine healing: Secondary | ICD-10-CM | POA: Diagnosis not present

## 2018-11-10 DIAGNOSIS — S52501D Unspecified fracture of the lower end of right radius, subsequent encounter for closed fracture with routine healing: Secondary | ICD-10-CM | POA: Diagnosis not present

## 2018-11-10 DIAGNOSIS — S52201D Unspecified fracture of shaft of right ulna, subsequent encounter for closed fracture with routine healing: Secondary | ICD-10-CM | POA: Diagnosis not present

## 2018-12-22 DIAGNOSIS — S52201D Unspecified fracture of shaft of right ulna, subsequent encounter for closed fracture with routine healing: Secondary | ICD-10-CM | POA: Diagnosis not present

## 2018-12-22 DIAGNOSIS — S52501D Unspecified fracture of the lower end of right radius, subsequent encounter for closed fracture with routine healing: Secondary | ICD-10-CM | POA: Diagnosis not present

## 2019-02-28 DIAGNOSIS — E785 Hyperlipidemia, unspecified: Secondary | ICD-10-CM | POA: Diagnosis not present

## 2019-02-28 DIAGNOSIS — I10 Essential (primary) hypertension: Secondary | ICD-10-CM | POA: Diagnosis not present

## 2019-02-28 DIAGNOSIS — E1165 Type 2 diabetes mellitus with hyperglycemia: Secondary | ICD-10-CM | POA: Diagnosis not present

## 2019-05-24 DIAGNOSIS — Z23 Encounter for immunization: Secondary | ICD-10-CM | POA: Diagnosis not present

## 2019-06-03 DIAGNOSIS — Z719 Counseling, unspecified: Secondary | ICD-10-CM | POA: Diagnosis not present

## 2020-01-13 DIAGNOSIS — I499 Cardiac arrhythmia, unspecified: Secondary | ICD-10-CM | POA: Diagnosis not present

## 2020-01-13 DIAGNOSIS — R9431 Abnormal electrocardiogram [ECG] [EKG]: Secondary | ICD-10-CM | POA: Diagnosis not present

## 2020-01-13 DIAGNOSIS — R009 Unspecified abnormalities of heart beat: Secondary | ICD-10-CM | POA: Diagnosis not present

## 2020-01-13 DIAGNOSIS — R110 Nausea: Secondary | ICD-10-CM | POA: Diagnosis not present

## 2020-06-26 DIAGNOSIS — Z719 Counseling, unspecified: Secondary | ICD-10-CM | POA: Diagnosis not present

## 2020-10-04 ENCOUNTER — Other Ambulatory Visit: Payer: Self-pay

## 2020-10-04 ENCOUNTER — Encounter (INDEPENDENT_AMBULATORY_CARE_PROVIDER_SITE_OTHER): Payer: Self-pay | Admitting: Ophthalmology

## 2020-10-04 ENCOUNTER — Ambulatory Visit (INDEPENDENT_AMBULATORY_CARE_PROVIDER_SITE_OTHER): Payer: Medicare Other | Admitting: Ophthalmology

## 2020-10-04 DIAGNOSIS — D3131 Benign neoplasm of right choroid: Secondary | ICD-10-CM

## 2020-10-04 DIAGNOSIS — Z8669 Personal history of other diseases of the nervous system and sense organs: Secondary | ICD-10-CM | POA: Diagnosis not present

## 2020-10-04 DIAGNOSIS — H43811 Vitreous degeneration, right eye: Secondary | ICD-10-CM

## 2020-10-04 DIAGNOSIS — E113391 Type 2 diabetes mellitus with moderate nonproliferative diabetic retinopathy without macular edema, right eye: Secondary | ICD-10-CM | POA: Diagnosis not present

## 2020-10-04 NOTE — Progress Notes (Signed)
10/04/2020     CHIEF COMPLAINT Patient presents for Retina Follow Up (3 Year diabetic f\u. OCT/Pt states vision has been stable. Denies new floaters and FOL./BGL: did not check)   HISTORY OF PRESENT ILLNESS: Kevin Salas is a 86 y.o. male who presents to the clinic today for:   HPI    Retina Follow Up    Patient presents with  Diabetic Retinopathy.  In right eye.  Severity is moderate.  Duration of 3 years.  Since onset it is stable.  I, the attending physician,  performed the HPI with the patient and updated documentation appropriately. Additional comments: 3 Year diabetic f\u. OCT Pt states vision has been stable. Denies new floaters and FOL. BGL: did not check       Last edited by Tilda Franco on 10/04/2020 10:30 AM. (History)      Referring physician: No referring provider defined for this encounter.  HISTORICAL INFORMATION:   Selected notes from the MEDICAL RECORD NUMBER       CURRENT MEDICATIONS: Current Outpatient Medications (Ophthalmic Drugs)  Medication Sig  . ofloxacin (OCUFLOX) 0.3 % ophthalmic solution Place 1 drop into the left eye 4 (four) times daily.  . prednisoLONE acetate (PRED FORTE) 1 % ophthalmic suspension Place 1 drop into the left eye 4 (four) times daily.   No current facility-administered medications for this visit. (Ophthalmic Drugs)   Current Outpatient Medications (Other)  Medication Sig  . amLODipine (NORVASC) 10 MG tablet Take 10 mg by mouth daily.  Marland Kitchen glipiZIDE (GLUCOTROL) 5 MG tablet Take 5 mg by mouth 2 (two) times daily before a meal.  . meclizine (ANTIVERT) 25 MG tablet Take 25 mg by mouth 2 (two) times daily as needed. For vertigo  . metoprolol (LOPRESSOR) 50 MG tablet Take 50 mg by mouth 2 (two) times daily.  . niacin 500 MG tablet Take 500 mg by mouth at bedtime.  . pravastatin (PRAVACHOL) 40 MG tablet Take 20 mg by mouth daily.  . traMADol (ULTRAM) 50 MG tablet Take 50 mg by mouth every 6 (six) hours as needed. For knee pain   . warfarin (COUMADIN) 5 MG tablet Take 5 mg by mouth daily.   No current facility-administered medications for this visit. (Other)      REVIEW OF SYSTEMS:    ALLERGIES No Known Allergies  PAST MEDICAL HISTORY Past Medical History:  Diagnosis Date  . Arthritis   . Diabetes mellitus without complication ([place])   . Dysrhythmia   . Hypertension   . Vertigo    Past Surgical History:  Procedure Laterality Date  . EYE SURGERY     Retina surgery.  Catartact Right eye.  Marland Kitchen PANRETINAL LASER NEONATAL  06/04/2012   Procedure: PANRETINAL LASER NEONATAL;  Surgeon: Hurman Horn, MD;  Location: [facility];  Service: Ophthalmology;  Laterality: Left;  no extra supplies needed for panretinal laser  . PARS PLANA VITRECTOMY  06/04/2012   Procedure: PARS PLANA VITRECTOMY WITH 25 GAUGE;  Surgeon: Hurman Horn, MD;  Location: [facility];  Service: Ophthalmology;  Laterality: Left;  . SILICON OIL REMOVAL  23/10/5730   Procedure: SILICON OIL REMOVAL;  Surgeon: Hurman Horn, MD;  Location: [facility];  Service: Ophthalmology;  Laterality: Left;  AND INSERT SILICONE OIL 2025KY LEFT EYE,  PROCEDURE: TPPV 25G ENDOLASER, REMOVE SILICONE OIL AND INSERT SILICONE OIL 7062BJ LEFT EYE    FAMILY HISTORY History reviewed. No pertinent family history.  SOCIAL HISTORY Social History   Tobacco Use  .  Smoking status: Never Smoker  Substance Use Topics  . Alcohol use: No  . Drug use: No         OPHTHALMIC EXAM:  Base Eye Exam    Visual Acuity (Snellen - Linear)      Right Left   Dist [place] 20/20 NLP       Tonometry (Tonopen, 10:36 AM)      Right Left   Pressure 19 21       Pupils      Dark Light Shape React   Right 3 2 Round Brisk   Left           Visual Fields (Counting fingers)      Left Right     Full   Restrictions Total superior temporal, inferior temporal, superior nasal, inferior nasal deficiencies        Dilation    Right eye: 1.0% Mydriacyl, 2.5% Phenylephrine @ 10:36 AM        Slit  Lamp and Fundus Exam    External Exam      Right Left   External Normal Normal       Slit Lamp Exam      Right Left   Lids/Lashes Normal Normal   Conjunctiva/Sclera White and quiet 1+ injection   Cornea Clear 1+ Edema, Scar, Corneal scar   Anterior Chamber Deep and quiet Deep and quiet   Iris Round and reactive Round and reactive   Lens Centered posterior chamber intraocular lens Clear   Anterior Vitreous Normal Normal       Fundus Exam      Right Left   Posterior Vitreous Posterior vitreous detachment No view   Disc Normal    C/D Ratio 0.45    Macula Normal    Vessels Normal    Periphery Normal           IMAGING AND PROCEDURES  Imaging and Procedures for 10/04/20           ASSESSMENT/PLAN:  Choroidal nevus of right eye Small, superior, no high risk features, in fact almost looks like CHRPE  Posterior vitreous detachment of right eye   The nature of posterior vitreous detachment was discussed with the patient as well as its physiology, its age prevalence, and its possible implication regarding retinal breaks and detachment.  An informational brochure was given to the patient.  All the patient's questions were answered.  The patient was asked to return if new or different flashes or floaters develops.   Patient was instructed to contact office immediately if any changes were noticed. I explained to the patient that vitreous inside the eye is similar to jello inside a bowl. As the jello melts it can start to pull away from the bowl, similarly the vitreous throughout our lives can begin to pull away from the retina. That process is called a posterior vitreous detachment. In some cases, the vitreous can tug hard enough on the retina to form a retinal tear. I discussed with the patient the signs and symptoms of a retinal detachment.  Do not rub the eye.  History of retinal detachment Globe OS stable, moderate symptoms of debris and mattering, no discomfort or pain  complaints at this time      ICD-10-CM   1. Controlled type 2 diabetes mellitus with moderate nonproliferative retinopathy of right eye, macular edema presence unspecified, unspecified whether long term insulin use (HCC)  E11.3391 OCT, Retina - OU - Both Eyes  2. Posterior vitreous detachment of right  eye  H43.811   3. Choroidal nevus of right eye  D31.31   4. History of retinal detachment  Z86.69     1.  OD, stable, patient understands wear safety glasses on all day every day basis to protect from accidental injury  2.  OS, comfortable we will continue to observe  3.  Ophthalmic Meds Ordered this visit:  No orders of the defined types were placed in this encounter.      Return in about 2 years (around 10/05/2022) for dilate, OD, COLOR FP.  There are no Patient Instructions on file for this visit.   Explained the diagnoses, plan, and follow up with the patient and they expressed understanding.  Patient expressed understanding of the importance of proper follow up care.   Kevin Salas M.D. Diseases & Surgery of the Retina and Vitreous [facility] 10/04/20     Abbreviations: M myopia (nearsighted); A astigmatism; H hyperopia (farsighted); P presbyopia; Mrx spectacle prescription;  CTL contact lenses; OD right eye; OS left eye; OU both eyes  XT exotropia; ET esotropia; PEK punctate epithelial keratitis; PEE punctate epithelial erosions; DES dry eye syndrome; MGD meibomian gland dysfunction; ATs artificial tears; PFAT's preservative free artificial tears; [place] nuclear sclerotic cataract; PSC posterior subcapsular cataract; ERM epi-retinal membrane; PVD posterior vitreous detachment; RD retinal detachment; DM diabetes mellitus; DR diabetic retinopathy; NPDR non-proliferative diabetic retinopathy; PDR proliferative diabetic retinopathy; CSME clinically significant macular edema; DME diabetic macular edema; dbh dot blot hemorrhages; CWS cotton wool spot; POAG primary  open angle glaucoma; C/D cup-to-disc ratio; HVF humphrey visual field; GVF goldmann visual field; OCT optical coherence tomography; IOP intraocular pressure; BRVO Branch retinal vein occlusion; CRVO central retinal vein occlusion; CRAO central retinal artery occlusion; BRAO branch retinal artery occlusion; RT retinal tear; SB scleral buckle; PPV pars plana vitrectomy; VH Vitreous hemorrhage; PRP panretinal laser photocoagulation; IVK intravitreal kenalog; VMT vitreomacular traction; MH Macular hole;  NVD neovascularization of the disc; NVE neovascularization elsewhere; AREDS age related eye disease study; ARMD age related macular degeneration; POAG primary open angle glaucoma; EBMD epithelial/anterior basement membrane dystrophy; ACIOL anterior chamber intraocular lens; IOL intraocular lens; PCIOL posterior chamber intraocular lens; Phaco/IOL phacoemulsification with intraocular lens placement; [place] photorefractive keratectomy; LASIK laser assisted in situ keratomileusis; HTN hypertension; DM diabetes mellitus; COPD chronic obstructive pulmonary disease

## 2020-10-04 NOTE — Assessment & Plan Note (Signed)

## 2020-10-04 NOTE — Assessment & Plan Note (Signed)
Globe OS stable, moderate symptoms of debris and mattering, no discomfort or pain complaints at this time

## 2020-10-04 NOTE — Assessment & Plan Note (Signed)
Small, superior, no high risk features, in fact almost looks like CHRPE

## 2020-10-15 ENCOUNTER — Encounter (INDEPENDENT_AMBULATORY_CARE_PROVIDER_SITE_OTHER): Payer: Self-pay | Admitting: Ophthalmology

## 2022-09-25 ENCOUNTER — Encounter (INDEPENDENT_AMBULATORY_CARE_PROVIDER_SITE_OTHER): Payer: Medicare Other | Admitting: Ophthalmology

## 2023-04-13 DIAGNOSIS — S7292XA Unspecified fracture of left femur, initial encounter for closed fracture: Secondary | ICD-10-CM | POA: Diagnosis not present

## 2023-04-13 DIAGNOSIS — M25562 Pain in left knee: Secondary | ICD-10-CM | POA: Diagnosis not present

## 2023-04-13 DIAGNOSIS — S80919A Unspecified superficial injury of unspecified knee, initial encounter: Secondary | ICD-10-CM | POA: Diagnosis not present

## 2023-04-13 DIAGNOSIS — R609 Edema, unspecified: Secondary | ICD-10-CM | POA: Diagnosis not present

## 2023-04-13 DIAGNOSIS — W19XXXA Unspecified fall, initial encounter: Secondary | ICD-10-CM | POA: Diagnosis not present

## 2023-04-13 DIAGNOSIS — I10 Essential (primary) hypertension: Secondary | ICD-10-CM | POA: Diagnosis not present

## 2023-04-14 DIAGNOSIS — S72451A Displaced supracondylar fracture without intracondylar extension of lower end of right femur, initial encounter for closed fracture: Secondary | ICD-10-CM | POA: Diagnosis not present

## 2023-04-14 DIAGNOSIS — G8918 Other acute postprocedural pain: Secondary | ICD-10-CM | POA: Diagnosis not present

## 2023-04-14 DIAGNOSIS — I10 Essential (primary) hypertension: Secondary | ICD-10-CM | POA: Diagnosis not present

## 2023-04-17 DIAGNOSIS — W19XXXD Unspecified fall, subsequent encounter: Secondary | ICD-10-CM | POA: Diagnosis not present

## 2023-04-17 DIAGNOSIS — R531 Weakness: Secondary | ICD-10-CM | POA: Diagnosis not present

## 2023-04-17 DIAGNOSIS — E119 Type 2 diabetes mellitus without complications: Secondary | ICD-10-CM | POA: Diagnosis not present

## 2023-04-17 DIAGNOSIS — M810 Age-related osteoporosis without current pathological fracture: Secondary | ICD-10-CM | POA: Diagnosis not present

## 2023-04-17 DIAGNOSIS — G8918 Other acute postprocedural pain: Secondary | ICD-10-CM | POA: Diagnosis not present

## 2023-04-17 DIAGNOSIS — R262 Difficulty in walking, not elsewhere classified: Secondary | ICD-10-CM | POA: Diagnosis not present

## 2023-04-17 DIAGNOSIS — I4892 Unspecified atrial flutter: Secondary | ICD-10-CM | POA: Diagnosis not present

## 2023-04-17 DIAGNOSIS — Z7401 Bed confinement status: Secondary | ICD-10-CM | POA: Diagnosis not present

## 2023-04-17 DIAGNOSIS — S72302D Unspecified fracture of shaft of left femur, subsequent encounter for closed fracture with routine healing: Secondary | ICD-10-CM | POA: Diagnosis not present

## 2023-04-17 DIAGNOSIS — I10 Essential (primary) hypertension: Secondary | ICD-10-CM | POA: Diagnosis not present

## 2023-04-17 DIAGNOSIS — K5904 Chronic idiopathic constipation: Secondary | ICD-10-CM | POA: Diagnosis not present

## 2023-04-17 DIAGNOSIS — D62 Acute posthemorrhagic anemia: Secondary | ICD-10-CM | POA: Diagnosis not present

## 2023-04-17 DIAGNOSIS — Z7901 Long term (current) use of anticoagulants: Secondary | ICD-10-CM | POA: Diagnosis not present

## 2023-04-17 DIAGNOSIS — M6281 Muscle weakness (generalized): Secondary | ICD-10-CM | POA: Diagnosis not present

## 2023-04-17 DIAGNOSIS — E785 Hyperlipidemia, unspecified: Secondary | ICD-10-CM | POA: Diagnosis not present

## 2023-04-17 DIAGNOSIS — R2681 Unsteadiness on feet: Secondary | ICD-10-CM | POA: Diagnosis not present

## 2023-04-17 DIAGNOSIS — R2689 Other abnormalities of gait and mobility: Secondary | ICD-10-CM | POA: Diagnosis not present

## 2023-04-17 DIAGNOSIS — S79929A Unspecified injury of unspecified thigh, initial encounter: Secondary | ICD-10-CM | POA: Diagnosis not present

## 2023-04-18 DIAGNOSIS — R262 Difficulty in walking, not elsewhere classified: Secondary | ICD-10-CM | POA: Diagnosis not present

## 2023-04-18 DIAGNOSIS — S72302D Unspecified fracture of shaft of left femur, subsequent encounter for closed fracture with routine healing: Secondary | ICD-10-CM | POA: Diagnosis not present

## 2023-04-18 DIAGNOSIS — D62 Acute posthemorrhagic anemia: Secondary | ICD-10-CM | POA: Diagnosis not present

## 2023-04-18 DIAGNOSIS — G8918 Other acute postprocedural pain: Secondary | ICD-10-CM | POA: Diagnosis not present

## 2023-05-26 DIAGNOSIS — M810 Age-related osteoporosis without current pathological fracture: Secondary | ICD-10-CM | POA: Diagnosis not present

## 2023-05-26 DIAGNOSIS — S72302D Unspecified fracture of shaft of left femur, subsequent encounter for closed fracture with routine healing: Secondary | ICD-10-CM | POA: Diagnosis not present

## 2023-05-26 DIAGNOSIS — E119 Type 2 diabetes mellitus without complications: Secondary | ICD-10-CM | POA: Diagnosis not present

## 2023-05-26 DIAGNOSIS — D62 Acute posthemorrhagic anemia: Secondary | ICD-10-CM | POA: Diagnosis not present

## 2023-05-26 DIAGNOSIS — R2681 Unsteadiness on feet: Secondary | ICD-10-CM | POA: Diagnosis not present
